# Patient Record
Sex: Female | Born: 2000 | Race: Black or African American | Hispanic: No | Marital: Single | Smoking: Current some day smoker
Health system: Southern US, Community
[De-identification: ages and names within clinical notes are randomized; demographics above are authoritative.]

---
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
## 2018-03-27 IMAGING — DX DG TOE GREAT 2+V*R*
3 series · 3 of 3 positions shown · non-contrast
Comparison: None available

CLINICAL DATA: Right great toe injury, pain and swelling

EXAM:
RIGHT GREAT TOE

[toe ap]
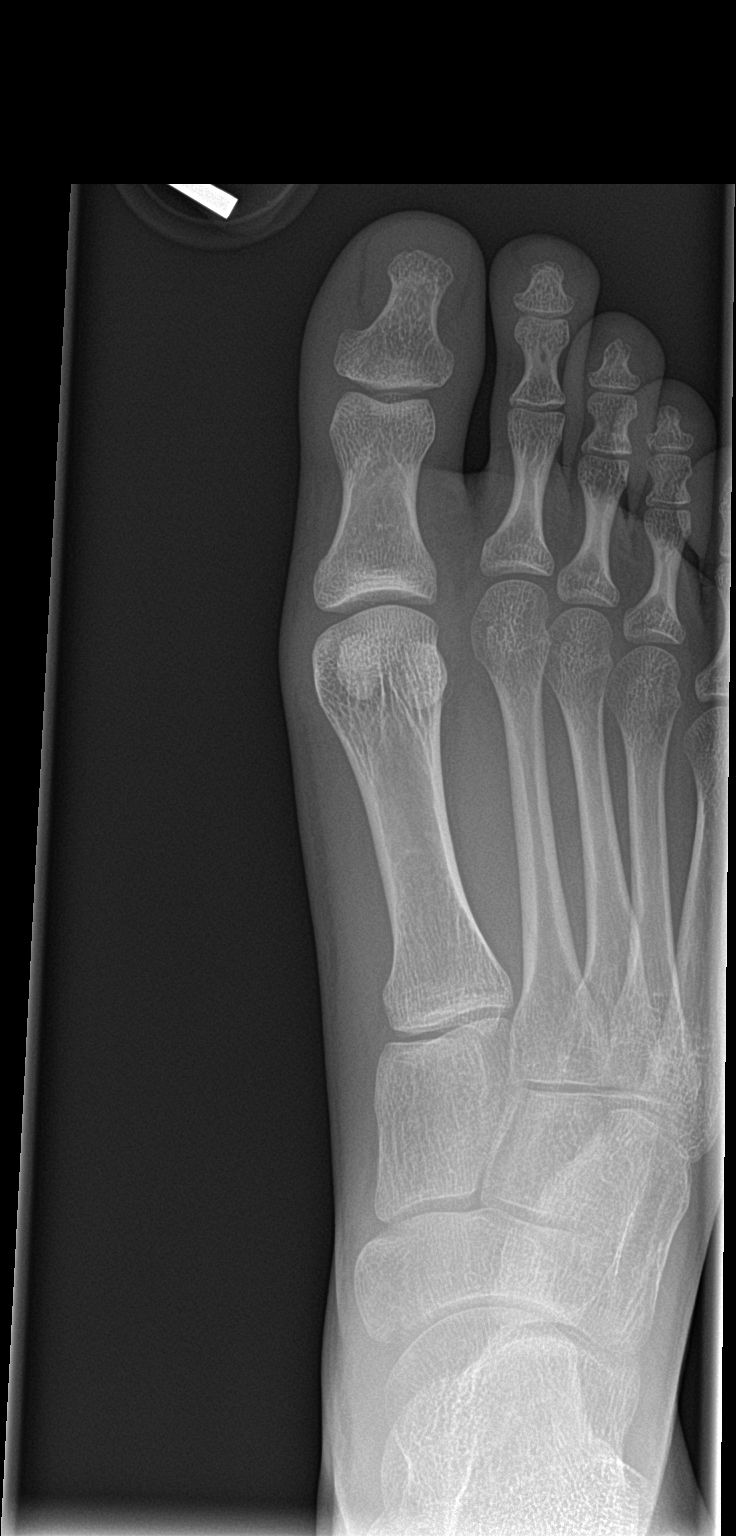

[toe obl]
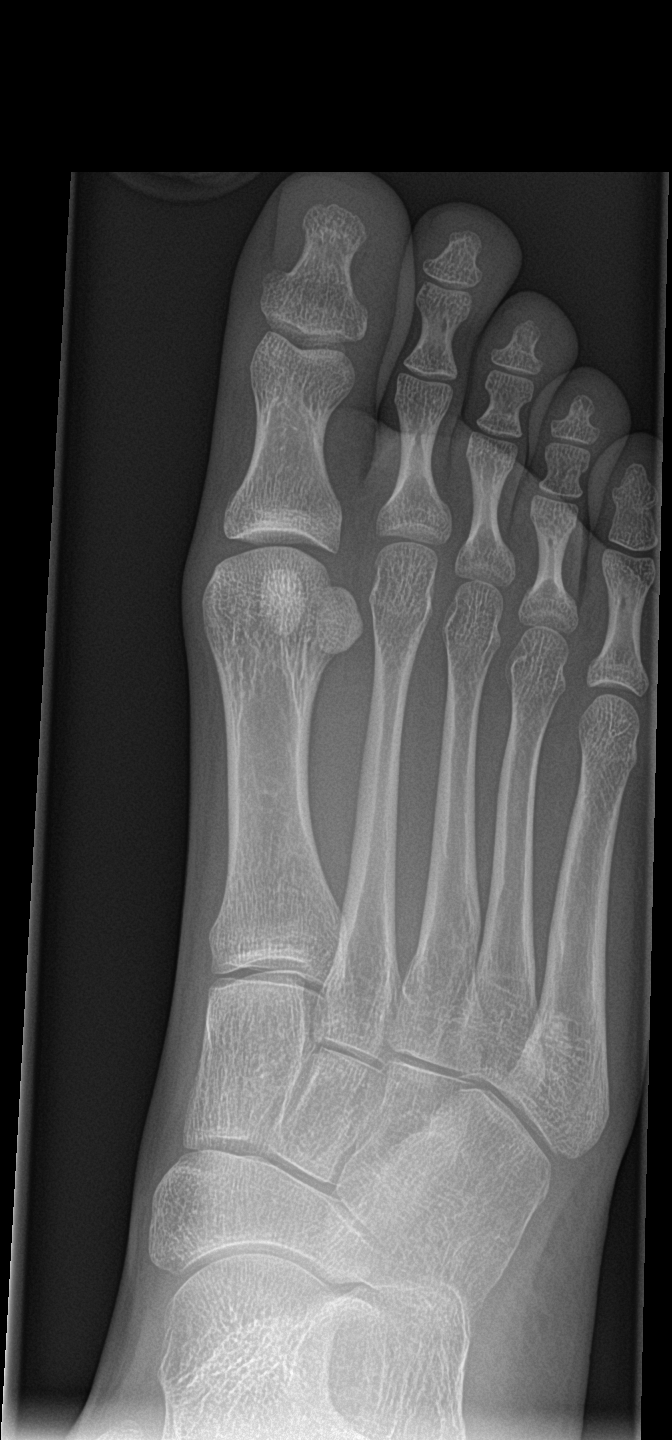

[toe lat]
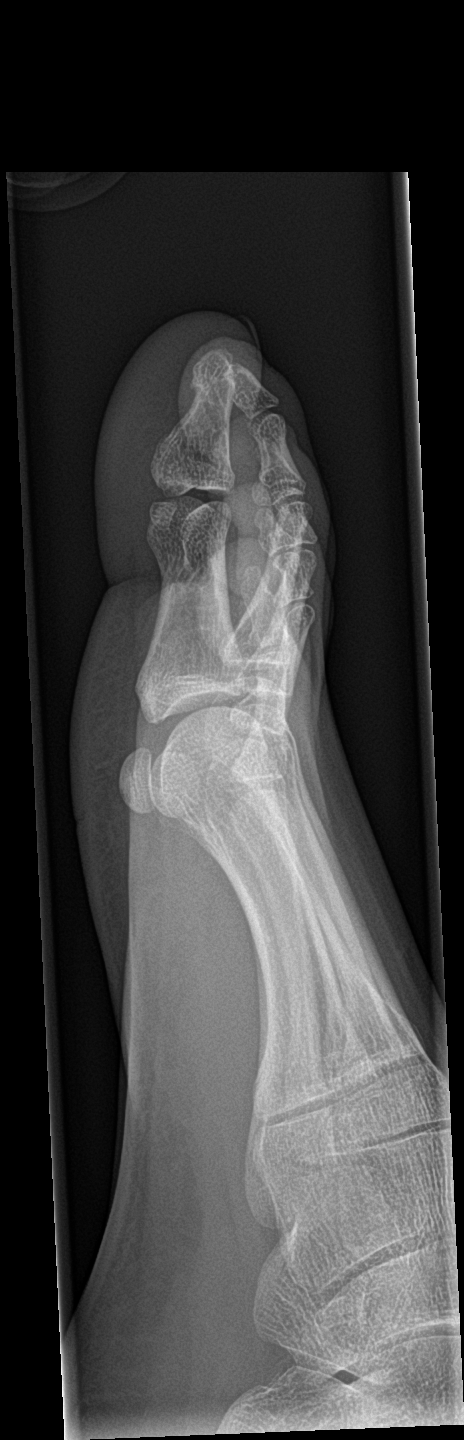

[3 of 3 positions shown; findings below may reference images not displayed]

FINDINGS: Normal alignment.  Mild soft tissue swelling.  No joint abnormality.

On the lateral view, there is cortical irregularity of the great toe
distal phalanx articular surface at the IP joint suspicious for a
nondisplaced right great toe distal phalanx fracture.
IMPRESSION: Findings suspicious for a nondisplaced right great toe distal
phalanx fracture as above.

## 2018-06-15 ENCOUNTER — Emergency Department (HOSPITAL_COMMUNITY)
Admission: EM | Admit: 2018-06-15 | Discharge: 2018-06-15 | Disposition: A | Discharge status: Home / Self Care | Payer: Medicaid Other | Attending: Emergency Medicine | Admitting: Emergency Medicine

## 2018-06-15 ENCOUNTER — Encounter (HOSPITAL_COMMUNITY): Payer: Self-pay

## 2018-06-15 ENCOUNTER — Other Ambulatory Visit: Payer: Self-pay

## 2018-06-15 DIAGNOSIS — N63 Unspecified lump in unspecified breast: Secondary | ICD-10-CM

## 2018-06-15 DIAGNOSIS — Z79899 Other long term (current) drug therapy: Secondary | ICD-10-CM | POA: Insufficient documentation

## 2018-06-15 DIAGNOSIS — N6323 Unspecified lump in the left breast, lower outer quadrant: Secondary | ICD-10-CM | POA: Insufficient documentation

## 2018-06-15 DIAGNOSIS — K2900 Acute gastritis without bleeding: Secondary | ICD-10-CM | POA: Insufficient documentation

## 2018-06-15 LAB — URINALYSIS, ROUTINE W REFLEX MICROSCOPIC
Bilirubin Urine: NEGATIVE
Glucose, UA: NEGATIVE mg/dL
Hgb urine dipstick: NEGATIVE
Ketones, ur: NEGATIVE mg/dL
NITRITE: NEGATIVE
PROTEIN: NEGATIVE mg/dL
Specific Gravity, Urine: 1.015 (ref 1.005–1.030)
pH: 7 (ref 5.0–8.0)

## 2018-06-15 LAB — COMPREHENSIVE METABOLIC PANEL
ALK PHOS: 78 U/L (ref 38–126)
ALT: 12 U/L (ref 0–44)
AST: 21 U/L (ref 15–41)
Albumin: 4.8 g/dL (ref 3.5–5.0)
Anion gap: 10 (ref 5–15)
BUN: 7 mg/dL (ref 6–20)
CALCIUM: 9.8 mg/dL (ref 8.9–10.3)
CO2: 24 mmol/L (ref 22–32)
Chloride: 106 mmol/L (ref 98–111)
Creatinine, Ser: 0.66 mg/dL (ref 0.44–1.00)
GFR calc Af Amer: >60 mL/min (ref >60)
GFR calc non Af Amer: >60 mL/min (ref >60)
Glucose, Bld: 98 mg/dL (ref 70–99)
Potassium: 3.4 mmol/L — ABNORMAL LOW (ref 3.5–5.1)
SODIUM: 140 mmol/L (ref 135–145)
TOTAL PROTEIN: 8.2 g/dL — AB (ref 6.5–8.1)
Total Bilirubin: 0.8 mg/dL (ref 0.3–1.2)

## 2018-06-15 LAB — CBC
HCT: 40.8 % (ref 36.0–46.0)
Hemoglobin: 11.9 g/dL — ABNORMAL LOW (ref 12.0–15.0)
MCH: 25.4 pg — ABNORMAL LOW (ref 26.0–34.0)
MCHC: 29.2 g/dL — ABNORMAL LOW (ref 30.0–36.0)
MCV: 87 fL (ref 80.0–100.0)
PLATELETS: 188 10*3/uL (ref 150–400)
RBC: 4.69 MIL/uL (ref 3.87–5.11)
RDW: 15.9 % — ABNORMAL HIGH (ref 11.5–15.5)
WBC: 6.4 10*3/uL (ref 4.0–10.5)
nRBC: 0 % (ref 0.0–0.2)

## 2018-06-15 LAB — URINALYSIS, MICROSCOPIC (REFLEX)

## 2018-06-15 LAB — PREGNANCY, URINE: PREG TEST UR: NEGATIVE

## 2018-06-15 LAB — LIPASE, BLOOD: LIPASE: 23 U/L (ref 11–51)

## 2018-06-15 MED ORDER — ONDANSETRON HCL 4 MG PO TABS: 4.0000 mg | ORAL_TABLET | Freq: Four times a day (QID) | ORAL | 0 refills | Status: AC | PRN

## 2018-06-15 MED ORDER — LIDOCAINE VISCOUS HCL 2 % MT SOLN
15.0000 mL | Freq: Once | OROMUCOSAL | Status: AC
  Administered 2018-06-15: 15 mL via ORAL
  Filled 2018-06-15: qty 15

## 2018-06-15 MED ORDER — FAMOTIDINE 20 MG PO TABS: 20.0000 mg | ORAL_TABLET | Freq: Two times a day (BID) | ORAL | 0 refills | Status: DC

## 2018-06-15 MED ORDER — FAMOTIDINE 20 MG PO TABS: 20.0000 mg | ORAL_TABLET | Freq: Two times a day (BID) | ORAL | 0 refills | Status: AC

## 2018-06-15 MED ORDER — ALUM & MAG HYDROXIDE-SIMETH 200-200-20 MG/5ML PO SUSP
30.0000 mL | Freq: Once | ORAL | Status: AC
  Administered 2018-06-15: 30 mL via ORAL
  Filled 2018-06-15: qty 30

## 2018-06-15 MED ORDER — ONDANSETRON HCL 4 MG PO TABS: 4.0000 mg | ORAL_TABLET | Freq: Four times a day (QID) | ORAL | 0 refills | Status: DC | PRN

## 2018-06-15 NOTE — ED Notes (Signed)
Pt reports no insurance  Given good RX card for meds

## 2018-06-15 NOTE — ED Provider Notes (Signed)
[facility] EMERGENCY DEPARTMENT Provider Note   CSN: [id number] Arrival date & time: 06/15/18  1304     History   Chief Complaint Chief Complaint  Patient presents with  . Abdominal Pain    HPI Janice Wall is a 18 y.o. female.  HPI Patient presents with epigastric pain that radiates up into her chest.  Patient states the pain is been episodic for some time now.  Currently denying any chest pain.  States the pain is worse after eating ketchup or pizza.  Patient does occasionally take ibuprofen for headaches.  Denies any shortness of breath.  Endorses occasional nausea but no vomiting.  She has had some loose stools but denies melanotic or grossly bloody stool.  No fever or chills.  Patient states she also has had a lump in her left breast for the past year.  States this is not changed in size.  It is nontender.  She has no family history of breast cancer.  No nipple changes. History reviewed. No pertinent past medical history.  There are no active problems to display for this patient.   History reviewed. No pertinent surgical history.   OB History   No obstetric history on file.      Home Medications    Prior to Admission medications   Medication Sig Start Date End Date Taking? Authorizing Provider  famotidine (PEPCID) 20 MG tablet Take 1 tablet (20 mg total) by mouth 2 (two) times daily. 06/15/18   Loren RacerYelverton, Ketan Renz, MD  HYDROcodone-acetaminophen (NORCO/VICODIN) 5-325 MG tablet Take 1 tablet by mouth every 6 (six) hours as needed. 10/07/16   Ivery QualeBryant, Hobson, PA-C  norgestrel-ethinyl estradiol (LO/OVRAL,CRYSELLE) 0.3-30 MG-MCG tablet Take 1 tablet by mouth daily.    [provider]  ondansetron (ZOFRAN) 4 MG tablet Take 1 tablet (4 mg total) by mouth every 6 (six) hours as needed for nausea or vomiting. 06/15/18   Loren RacerYelverton, Denia Mcvicar, MD    Family History History reviewed. No pertinent family history.  Social History Social History   Tobacco Use  . Smoking  status: Never Smoker  . Smokeless tobacco: Never Used  Substance Use Topics  . Alcohol use: No  . Drug use: No     Allergies   Patient has no known allergies.   Review of Systems Review of Systems  Constitutional: Negative for chills and fever.  Respiratory: Negative for cough and shortness of breath.   Cardiovascular: Positive for chest pain. Negative for palpitations and leg swelling.  Gastrointestinal: Positive for abdominal pain and nausea. Negative for constipation, diarrhea and vomiting.  Genitourinary: Negative for dysuria, flank pain, frequency and pelvic pain.  Musculoskeletal: Negative for back pain, myalgias and neck pain.  Skin: Negative for rash and wound.  Neurological: Negative for dizziness, weakness, light-headedness and numbness.  All other systems reviewed and are negative.    Physical Exam Updated Vital Signs BP (!) 126/91 (BP Location: Left Arm)   Pulse 75   Temp 98.6 F (37 C) (Temporal)   Resp 18   Ht 5' (1.524 m)   Wt 47.6 kg   LMP 05/29/2018   SpO2 100%   BMI 20.51 kg/m   Physical Exam Vitals signs and nursing note reviewed. Exam conducted with a chaperone present.  Constitutional:      General: She is not in acute distress.    Appearance: Normal appearance. She is well-developed. She is not ill-appearing.  HENT:     Head: Normocephalic and atraumatic.  Eyes:     Pupils: Pupils  are equal, round, and reactive to light.  Neck:     Musculoskeletal: Normal range of motion and neck supple. No neck rigidity or muscular tenderness.  Cardiovascular:     Rate and Rhythm: Normal rate and regular rhythm.     Heart sounds: No murmur. No friction rub. No gallop.   Pulmonary:     Effort: Pulmonary effort is normal. No respiratory distress.     Breath sounds: Normal breath sounds. No stridor. No wheezing, rhonchi or rales.  Chest:     Chest wall: No tenderness.    Abdominal:     General: Bowel sounds are normal.     Palpations: Abdomen is soft.  There is no mass.     Tenderness: There is abdominal tenderness. There is no right CVA tenderness, left CVA tenderness, guarding or rebound.     Comments: Epigastric tenderness to palpation.  No rebound or guarding.  Musculoskeletal: Normal range of motion.        General: No swelling, tenderness, deformity or signs of injury.     Right lower leg: No edema.     Left lower leg: No edema.     Comments: No lower extremity swelling, asymmetry or tenderness.  Distal pulses intact.  Lymphadenopathy:     Cervical: No cervical adenopathy.  Skin:    General: Skin is warm and dry.     Capillary Refill: Capillary refill takes less than 2 seconds.     Findings: No erythema or rash.  Neurological:     General: No focal deficit present.     Mental Status: She is alert and oriented to person, place, and time.  Psychiatric:        Behavior: Behavior normal.      ED Treatments / Results  Labs (all labs ordered are listed, but only abnormal results are displayed) Labs Reviewed  COMPREHENSIVE METABOLIC PANEL - Abnormal; Notable for the following components:      Result Value   Potassium 3.4 (*)    Total Protein 8.2 (*)    All other components within normal limits  CBC - Abnormal; Notable for the following components:   Hemoglobin 11.9 (*)    MCH 25.4 (*)    MCHC 29.2 (*)    RDW 15.9 (*)    All other components within normal limits  URINALYSIS, ROUTINE W REFLEX MICROSCOPIC - Abnormal; Notable for the following components:   Leukocytes, UA TRACE (*)    All other components within normal limits  URINALYSIS, MICROSCOPIC (REFLEX) - Abnormal; Notable for the following components:   Bacteria, UA FEW (*)    All other components within normal limits  LIPASE, BLOOD  PREGNANCY, URINE    EKG EKG Interpretation  Date/Time:  Saturday June 15 2018 18:09:47 EST Ventricular Rate:  66 PR Interval:    QRS Duration: 102 QT Interval:  429 QTC Calculation: 450 R Axis:   82 Text Interpretation:   Sinus rhythm Borderline short PR interval Confirmed by Palumbo, April ([id number]) on 06/16/2018 9:24:13 AM   Radiology No results found.  Procedures Procedures (including critical care time)  Medications Ordered in ED Medications  alum & mag hydroxide-simeth (MAALOX/MYLANTA) 200-200-20 MG/5ML suspension 30 mL (30 mLs Oral Given 06/15/18 1638)    And  lidocaine (XYLOCAINE) 2 % viscous mouth solution 15 mL (15 mLs Oral Given 06/15/18 1637)     Initial Impression / Assessment and Plan / ED Course  I have reviewed the triage vital signs and the nursing notes.  Pertinent labs &  imaging results that were available during my care of the patient were reviewed by me and considered in my medical decision making (see chart for details).     Suspect gastritis with reflux disease.  Low suspicion for CAD. Breast mass without concerning signs for malignancy.  Have encouraged patient to follow-up.  Patient says she is feeling much better after GI cocktail.  Will start on Pepcid.  Return precautions given. Final Clinical Impressions(s) / ED Diagnoses   Final diagnoses:  Acute gastritis without hemorrhage, unspecified gastritis type  Breast nodule    ED Discharge Orders         Ordered    famotidine (PEPCID) 20 MG tablet  2 times daily,   Status:  Discontinued     06/15/18 1840    ondansetron (ZOFRAN) 4 MG tablet  Every 6 hours PRN,   Status:  Discontinued     06/15/18 1840    famotidine (PEPCID) 20 MG tablet  2 times daily     06/15/18 1844    ondansetron (ZOFRAN) 4 MG tablet  Every 6 hours PRN     06/15/18 1844           Loren RacerYelverton, Jabarri Stefanelli, MD 06/16/18 1529

## 2018-06-15 NOTE — ED Triage Notes (Signed)
Pt reports epigastric pain that began this morning and feels like something is getting stuck. Also has marble sized area to left breast for months

## 2018-10-11 ENCOUNTER — Other Ambulatory Visit: Payer: Self-pay

## 2018-10-11 ENCOUNTER — Encounter (HOSPITAL_COMMUNITY): Payer: Self-pay | Admitting: Emergency Medicine

## 2018-10-11 ENCOUNTER — Emergency Department (HOSPITAL_COMMUNITY)
Admission: EM | Admit: 2018-10-11 | Discharge: 2018-10-11 | Disposition: A | Discharge status: Home / Self Care | Payer: Self-pay | Attending: Emergency Medicine | Admitting: Emergency Medicine

## 2018-10-11 DIAGNOSIS — Z041 Encounter for examination and observation following transport accident: Secondary | ICD-10-CM | POA: Insufficient documentation

## 2018-10-11 DIAGNOSIS — Z5321 Procedure and treatment not carried out due to patient leaving prior to being seen by health care provider: Secondary | ICD-10-CM | POA: Insufficient documentation

## 2018-10-11 NOTE — ED Notes (Signed)
Pt not in the waiting room when called
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
## 2018-10-12 ENCOUNTER — Emergency Department (HOSPITAL_COMMUNITY): Payer: No Typology Code available for payment source

## 2018-10-12 ENCOUNTER — Encounter (HOSPITAL_COMMUNITY): Payer: Self-pay | Admitting: Emergency Medicine

## 2018-10-12 ENCOUNTER — Other Ambulatory Visit: Payer: Self-pay

## 2018-10-12 ENCOUNTER — Emergency Department (HOSPITAL_COMMUNITY)
Admission: EM | Admit: 2018-10-12 | Discharge: 2018-10-13 | Disposition: A | Discharge status: Home / Self Care | Payer: No Typology Code available for payment source | Attending: Emergency Medicine | Admitting: Emergency Medicine

## 2018-10-12 DIAGNOSIS — Y939 Activity, unspecified: Secondary | ICD-10-CM | POA: Insufficient documentation

## 2018-10-12 DIAGNOSIS — G44309 Post-traumatic headache, unspecified, not intractable: Secondary | ICD-10-CM | POA: Diagnosis not present

## 2018-10-12 DIAGNOSIS — Y999 Unspecified external cause status: Secondary | ICD-10-CM | POA: Insufficient documentation

## 2018-10-12 DIAGNOSIS — Y92410 Unspecified street and highway as the place of occurrence of the external cause: Secondary | ICD-10-CM | POA: Insufficient documentation

## 2018-10-12 DIAGNOSIS — M542 Cervicalgia: Secondary | ICD-10-CM | POA: Diagnosis present

## 2018-10-12 DIAGNOSIS — Z79899 Other long term (current) drug therapy: Secondary | ICD-10-CM | POA: Diagnosis not present
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
## 2018-10-12 NOTE — ED Notes (Signed)
Pt ambulated heel to toe to room from triage with erect posture, relaxed facial features   Without stagger or drift
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
## 2018-10-13 NOTE — ED Notes (Signed)
Pt ambulatory to waiting room. Pt verbalized understanding of discharge instructions.   

## 2019-09-11 ENCOUNTER — Emergency Department (HOSPITAL_COMMUNITY): Payer: Self-pay

## 2019-09-11 ENCOUNTER — Encounter (HOSPITAL_COMMUNITY): Payer: Self-pay | Admitting: Emergency Medicine

## 2019-09-11 ENCOUNTER — Emergency Department (HOSPITAL_COMMUNITY)
Admission: EM | Admit: 2019-09-11 | Discharge: 2019-09-11 | Disposition: A | Discharge status: Home / Self Care | Payer: Self-pay | Attending: Emergency Medicine | Admitting: Emergency Medicine

## 2019-09-11 ENCOUNTER — Other Ambulatory Visit: Payer: Self-pay

## 2019-09-11 DIAGNOSIS — Y939 Activity, unspecified: Secondary | ICD-10-CM | POA: Insufficient documentation

## 2019-09-11 DIAGNOSIS — F17210 Nicotine dependence, cigarettes, uncomplicated: Secondary | ICD-10-CM | POA: Insufficient documentation

## 2019-09-11 DIAGNOSIS — W19XXXA Unspecified fall, initial encounter: Secondary | ICD-10-CM | POA: Insufficient documentation

## 2019-09-11 DIAGNOSIS — Y929 Unspecified place or not applicable: Secondary | ICD-10-CM | POA: Insufficient documentation

## 2019-09-11 DIAGNOSIS — Y999 Unspecified external cause status: Secondary | ICD-10-CM | POA: Insufficient documentation

## 2019-09-11 DIAGNOSIS — S43014A Anterior dislocation of right humerus, initial encounter: Secondary | ICD-10-CM | POA: Insufficient documentation

## 2019-09-11 DIAGNOSIS — Z79899 Other long term (current) drug therapy: Secondary | ICD-10-CM | POA: Insufficient documentation

## 2019-09-11 LAB — I-STAT BETA HCG BLOOD, ED (MC, WL, AP ONLY): I-stat hCG, quantitative: <5 m[IU]/mL (ref <5)

## 2019-09-11 MED ORDER — KETAMINE HCL 10 MG/ML IJ SOLN
1.0000 mg/kg | Freq: Once | INTRAMUSCULAR | Status: AC
  Administered 2019-09-11: 14:00:00 39 mg via INTRAVENOUS
  Filled 2019-09-11: qty 1

## 2019-09-11 MED ORDER — ONDANSETRON HCL 4 MG/2ML IJ SOLN
4.0000 mg | Freq: Once | INTRAMUSCULAR | Status: AC
  Administered 2019-09-11: 4 mg via INTRAVENOUS
  Filled 2019-09-11: qty 2

## 2019-09-11 MED ORDER — FENTANYL CITRATE (PF) 100 MCG/2ML IJ SOLN
50.0000 ug | Freq: Once | INTRAMUSCULAR | Status: AC
  Administered 2019-09-11: 13:00:00 50 ug via INTRAVENOUS
  Filled 2019-09-11: qty 2

## 2019-09-11 MED ORDER — HYDROCODONE-ACETAMINOPHEN 5-325 MG PO TABS: 1.0000 | ORAL_TABLET | Freq: Four times a day (QID) | ORAL | 0 refills | Status: AC | PRN

## 2019-09-11 MED ORDER — PROPOFOL 10 MG/ML IV BOLUS
1.0000 mg/kg | Freq: Once | INTRAVENOUS | Status: AC
  Administered 2019-09-11: 39 mg via INTRAVENOUS
  Filled 2019-09-11: qty 20

## 2019-09-11 MED ORDER — KETAMINE HCL 10 MG/ML IJ SOLN
INTRAMUSCULAR | Status: AC | PRN
  Administered 2019-09-11: 19.5 mg via INTRAVENOUS

## 2019-09-11 MED ORDER — PROPOFOL 10 MG/ML IV BOLUS
INTRAVENOUS | Status: AC | PRN
  Administered 2019-09-11: 39 mg via INTRAVENOUS

## 2019-09-11 MED ORDER — SODIUM CHLORIDE 0.9 % IV BOLUS
1000.0000 mL | Freq: Once | INTRAVENOUS | Status: AC
  Administered 2019-09-11: 1000 mL via INTRAVENOUS

## 2019-09-11 NOTE — ED Notes (Signed)
Have wasted 161 mg of Propofol with Roma Kayser RN.

## 2019-09-11 NOTE — ED Triage Notes (Signed)
Patient c/o rt shoulder dislocation with numbness and tingling.

## 2019-09-11 NOTE — ED Provider Notes (Signed)
[facility] EMERGENCY DEPARTMENT Provider Note   CSN: [id number] Arrival date & time: 09/11/19  1158     History Chief Complaint  Patient presents with  . Shoulder Pain    Janice Wall is a 19 y.o. female who presents for evaluation of right shoulder dislocation.  Patient reports that she was playing with some children earlier this morning and fell, landing on her right shoulder.  Since then she has had pain and difficulty moving the shoulder.  She also reports some numbness noted to the upper arm.  She tried to put it back in place at home but was unsuccessful, prompting ED visit.  She has never had this happen before.   The history is provided by the patient.       History reviewed. No pertinent past medical history.  There are no problems to display for this patient.   History reviewed. No pertinent surgical history.   OB History    Gravida      Para      Term      Preterm      AB      Living  0     SAB      TAB      Ectopic      Multiple      Live Births              No family history on file.  Social History   Tobacco Use  . Smoking status: Current Some Day Smoker    Types: Cigarettes  . Smokeless tobacco: Never Used  Substance Use Topics  . Alcohol use: Yes    Comment: occas  . Drug use: No    Home Medications Prior to Admission medications   Medication Sig Start Date End Date Taking? Authorizing Provider  famotidine (PEPCID) 20 MG tablet Take 1 tablet (20 mg total) by mouth 2 (two) times daily. 06/15/18   Loren Racer, MD  HYDROcodone-acetaminophen (NORCO/VICODIN) 5-325 MG tablet Take 1-2 tablets by mouth every 6 (six) hours as needed. 09/11/19   Maxwell Caul, PA-C  norgestrel-ethinyl estradiol (LO/OVRAL,CRYSELLE) 0.3-30 MG-MCG tablet Take 1 tablet by mouth daily.    [provider]  ondansetron (ZOFRAN) 4 MG tablet Take 1 tablet (4 mg total) by mouth every 6 (six) hours as needed for nausea or vomiting. 06/15/18    Loren Racer, MD    Allergies    Patient has no known allergies.  Review of Systems   Review of Systems  Musculoskeletal:       Right shoulder pain  Neurological: Positive for numbness. Negative for weakness.  All other systems reviewed and are negative.   Physical Exam Updated Vital Signs BP 134/90   Pulse 87   Temp 99.4 F (37.4 C) (Oral)   Resp (!) 21   Ht 5\' 1"  (1.549 m)   Wt 39 kg   LMP 08/29/2019 (Approximate)   SpO2 100%   BMI 16.25 kg/m   Physical Exam Vitals and nursing note reviewed.  Constitutional:      Appearance: She is well-developed.  HENT:     Head: Normocephalic and atraumatic.  Eyes:     General: No scleral icterus.       Right eye: No discharge.        Left eye: No discharge.     Conjunctiva/sclera: Conjunctivae normal.  Cardiovascular:     Pulses:          Radial pulses are 2+ on the right  side and 2+ on the left side.  Pulmonary:     Effort: Pulmonary effort is normal.  Musculoskeletal:     Comments: Tenderness palpation in right shoulder with obvious deformity.  Limited range of motion secondary to pain.  No bony tenderness noted right elbow, right forearm, right wrist.  No tenderness palpation in left upper extremity.  Full range of motion of left shoulder without any difficulty.  Skin:    General: Skin is warm and dry.     Comments: Good distal cap refill.  RUE is not dusky in appearance or cool to touch.  Neurological:     Mental Status: She is alert.     Comments: Slightly decreased sensation noted to upper arm.  She has full sensation intact distally.  Psychiatric:        Speech: Speech normal.        Behavior: Behavior normal.     ED Results / Procedures / Treatments   Labs (all labs ordered are listed, but only abnormal results are displayed) Labs Reviewed  I-STAT BETA HCG BLOOD, ED ([facility], [facility], [facility] ONLY)    EKG None  Radiology DG Shoulder Right  Result Date: 09/11/2019 CLINICAL DATA:  Post reduction for anterior  dislocation EXAM: RIGHT SHOULDER - 2+ VIEW COMPARISON:  Pre reduction radiographs obtained Sep 11, 2019 earlier in the day FINDINGS: Frontal and Y scapular images were obtained. There has been successful reduction of anterior dislocation compared to study obtained earlier in the day. Currently no fracture or dislocation. No evident arthropathy. Visualized right lung clear. IMPRESSION: Successful reduction of anterior shoulder dislocation. Currently no fracture or dislocation. No evident arthropathy. Electronically Signed   By: Lowella Grip III M.D.   On: 09/11/2019 14:04   DG Shoulder Right  Result Date: 09/11/2019 CLINICAL DATA:  Shoulder pain. Fell on RIGHT shoulder while playing outside with the kids. Unable to move the arm. EXAM: RIGHT SHOULDER - 2+ VIEW COMPARISON:  Chest x-ray on 07/29/2016 FINDINGS: There is anterior dislocation of the RIGHT shoulder, associated with Hill-Sachs deformity of the humeral head. No other acute fracture identified. RIGHT lung apex is clear. IMPRESSION: Anterior dislocation of the RIGHT shoulder. Electronically Signed   By: Nolon Nations M.D.   On: 09/11/2019 12:58    Procedures Reduction of dislocation  Date/Time: 09/11/2019 3:02 PM Performed by: Volanda Napoleon, PA-C Authorized by: Volanda Napoleon, PA-C  Consent: Verbal consent obtained. Risks and benefits: risks, benefits and alternatives were discussed Consent given by: patient Patient understanding: patient states understanding of the procedure being performed Patient consent: the patient's understanding of the procedure matches consent given Procedure consent: procedure consent matches procedure scheduled Relevant documents: relevant documents present and verified Site marked: the operative site was marked Imaging studies: imaging studies available Required items: required blood products, implants, devices, and special equipment available Time out: Immediately prior to procedure a "time out" was  called to verify the correct patient, procedure, equipment, support staff and site/side marked as required. Patient tolerance: patient tolerated the procedure well with no immediate complications    (including critical care time)  Medications Ordered in ED Medications  fentaNYL (SUBLIMAZE) injection 50 mcg (50 mcg Intravenous Given 09/11/19 1318)  ondansetron (ZOFRAN) injection 4 mg (4 mg Intravenous Given 09/11/19 1314)  sodium chloride 0.9 % bolus 1,000 mL (0 mLs Intravenous Stopped 09/11/19 1439)  propofol (DIPRIVAN) 10 mg/mL bolus/IV push 39 mg (39 mg Intravenous Given 09/11/19 1337)  ketamine (KETALAR) injection 39 mg (39 mg Intravenous Given  09/11/19 1339)  propofol (DIPRIVAN) 10 mg/mL bolus/IV push (39 mg Intravenous Given 09/11/19 1344)  ketamine (KETALAR) injection (19.5 mg Intravenous Given 09/11/19 1346)    ED Course  I have reviewed the triage vital signs and the nursing notes.  Pertinent labs & imaging results that were available during my care of the patient were reviewed by me and considered in my medical decision making (see chart for details).    MDM Rules/Calculators/A&P                      19 year old female who presents for evaluation of shoulder pain after mechanical fall that occurred earlier this morning.  Limited range of motion secondary to pain since the accident.  She reports little bit of numbness to the upper arm.  On initial arrival, she is afebrile, nontoxic-appearing.  Vital signs are stable.  On exam, she has tenderness noted to the right shoulder with obvious deformity.  Concern for dislocation versus fracture.  Plan for x-rays.  X-ray shows anterior dislocation of the right shoulder associated with Hill-Sachs deformity of the humeral head.  Reduction as documented above.  Patient tolerated procedure well.  Please see separate attending note for sedation note.  Post reduction x-ray shows successful reduction.  Patient placed in sling immobilizer.  Reevaluation.   Patient is alert and oriented.  Patient with good radial pulse.  Instructed patient she will need to follow-up with orthopedics. At this time, patient exhibits no emergent life-threatening condition that require further evaluation in ED or admission. Patient had ample opportunity for questions and discussion. All patient's questions were answered with full understanding. Strict return precautions discussed. Patient expresses understanding and agreement to plan.   Portions of this note were generated with Scientist, clinical (histocompatibility and immunogenetics). Dictation errors may occur despite best attempts at proofreading.  Final Clinical Impression(s) / ED Diagnoses Final diagnoses:  Anterior dislocation of right shoulder, initial encounter    Rx / DC Orders ED Discharge Orders         Ordered    HYDROcodone-acetaminophen (NORCO/VICODIN) 5-325 MG tablet  Every 6 hours PRN     09/11/19 1458           Maxwell Caul, PA-C 09/11/19 1502    Sabas Sous, MD 09/14/19 [phone]

## 2019-09-11 NOTE — Discharge Instructions (Signed)
You can take Tylenol or Ibuprofen as directed for pain. You can alternate Tylenol and Ibuprofen every 4 hours. If you take Tylenol at 1pm, then you can take Ibuprofen at 5pm. Then you can take Tylenol again at 9pm.   Take pain medications as directed for break through pain. Do not drive or operate machinery while taking this medication.   Wear the sling immobilizer at all times until you see Ortho.  Call Ortho and arrange for an outpatient appointment.  Return to the emergency department for any worsening pain, repeat dislocation, numbness or any other worsening or concerning symptoms.

## 2019-09-14 NOTE — ED Provider Notes (Signed)
.  Sedation  Date/Time: 09/14/2019 3:17 PM Performed by: Sabas Sous, MD Authorized by: Sabas Sous, MD   Consent:    Consent obtained:  Verbal and written   Consent given by:  Patient   Risks discussed:  Allergic reaction, dysrhythmia, inadequate sedation, nausea, prolonged hypoxia resulting in organ damage, prolonged sedation necessitating reversal, respiratory compromise necessitating ventilatory assistance and intubation and vomiting   Alternatives discussed:  Analgesia without sedation, anxiolysis and regional anesthesia Universal protocol:    Procedure explained and questions answered to patient or proxy's satisfaction: yes     Relevant documents present and verified: yes     Test results available and properly labeled: yes     Imaging studies available: yes     Required blood products, implants, devices, and special equipment available: yes     Site/side marked: yes     Immediately prior to procedure a time out was called: yes     Patient identity confirmation method:  Verbally with patient, arm band, hospital-assigned identification number and provided demographic data Indications:    Procedure necessitating sedation performed by:  Different physician Pre-sedation assessment:    Time since last food or drink:  4 hours   ASA classification: class 1 - normal, healthy patient     Neck mobility: normal     Mouth opening:  3 or more finger widths   Thyromental distance:  4 finger widths   Mallampati score:  I - soft palate, uvula, fauces, pillars visible   Pre-sedation assessments completed and reviewed: airway patency, cardiovascular function, hydration status, mental status, nausea/vomiting, pain level, respiratory function and temperature   Immediate pre-procedure details:    Reassessment: Patient reassessed immediately prior to procedure     Reviewed: vital signs, relevant labs/tests and NPO status     Verified: bag valve mask available, emergency equipment available,  intubation equipment available, IV patency confirmed, oxygen available and suction available   Procedure details (see MAR for exact dosages):    Preoxygenation:  Nasal cannula   Sedation:  Propofol and ketamine   Intended level of sedation: deep   Intra-procedure monitoring:  Blood pressure monitoring, cardiac monitor, continuous pulse oximetry, frequent LOC assessments, frequent vital sign checks and continuous capnometry   Intra-procedure events: none     Total Provider sedation time (minutes):  22 Post-procedure details:    Attendance: Constant attendance by certified staff until patient recovered     Recovery: Patient returned to pre-procedure baseline     Post-sedation assessments completed and reviewed: airway patency, cardiovascular function, hydration status, mental status, nausea/vomiting, pain level, respiratory function and temperature     Patient is stable for discharge or admission: yes     Patient tolerance:  Tolerated well, no immediate complications Comments:     Uncomplicated procedural sedation using 40 mg propofol and 20 mg ketamine.      Sabas Sous, MD 09/14/19 [phone]

## 2019-09-19 ENCOUNTER — Other Ambulatory Visit: Payer: Self-pay

## 2019-09-19 ENCOUNTER — Ambulatory Visit: Payer: Self-pay | Admitting: Orthopedic Surgery

## 2019-09-19 VITALS — Temp 98.6°F | Ht 61.5 in | Wt 81.0 lb

## 2019-09-19 DIAGNOSIS — S43004A Unspecified dislocation of right shoulder joint, initial encounter: Secondary | ICD-10-CM

## 2019-09-19 NOTE — Patient Instructions (Signed)
14 days of sling wear   Start therapy

## 2019-09-19 NOTE — Progress Notes (Signed)
EMERGENCY ROOM FOLLOW UP  NEW PROBLEM/PATIENT   Patient ID: Janice Wall, female   DOB: 28-Apr-2001, 19 y.o.   MRN: [id number]  Emergency room record from (date) 5/6 has been reviewed and this is included by reference and includes the review of systems with the following addition:   Chief Complaint  Patient presents with  . Shoulder Injury    ER follow up on right shoulder, DOI 09-11-19.    HPI Janice Wall is a 19 y.o. female.  Presents after emergency room visit for shoulder dislocation  19 year old female right-hand-dominant first-time traumatic shoulder dislocation sustained while she was playing with her children.  She had a closed reduction in the emergency room she was placed in a sling.  When she is out of the sling she says the shoulder aches and she does complain of sensation changes in the lateral deltoid with a numb-like feeling   Review of Systems Review of Systems  Neurological: Positive for numbness.  All other systems reviewed and are negative.    has no past medical history on file.   No past surgical history on file.  No family history on file.  Social History Social History   Tobacco Use  . Smoking status: Current Some Day Smoker    Types: Cigarettes  . Smokeless tobacco: Never Used  Substance Use Topics  . Alcohol use: Yes    Comment: occas  . Drug use: No    No Known Allergies  Current Outpatient Medications  Medication Sig Dispense Refill  . famotidine (PEPCID) 20 MG tablet Take 1 tablet (20 mg total) by mouth 2 (two) times daily. 30 tablet 0  . HYDROcodone-acetaminophen (NORCO/VICODIN) 5-325 MG tablet Take 1-2 tablets by mouth every 6 (six) hours as needed. 8 tablet 0  . norgestrel-ethinyl estradiol (LO/OVRAL,CRYSELLE) 0.3-30 MG-MCG tablet Take 1 tablet by mouth daily.    . ondansetron (ZOFRAN) 4 MG tablet Take 1 tablet (4 mg total) by mouth every 6 (six) hours as needed for nausea or vomiting. 12 tablet 0   No current facility-administered  medications for this visit.    Physical Exam Temp 98.6 F (37 C)   Ht 5' 1.5" (1.562 m)   Wt 81 lb (36.7 kg)   LMP 08/29/2019 (Approximate)   BMI 15.06 kg/m  Body mass index is 15.06 kg/m.  Well developed and well nourished  Stands with normal weight bearing line  Alert and oriented x 3  Normal affect and mood  Ortho Exam  Left shoulder full range of motion does not feel unstable strength is normal skin is intact neurovascular exam is normal and there is no lymphadenopathy or tenderness.  Right shoulder is tender around the anterior joint line there is decreased sensation in the lateral deltoid range of motion up to 90 degrees flexion and abduction are normal she was not put in the abduction external rotation dislocating position lymph nodes are normal pulses are good  No neurologic deficits on hand examination Data Reviewed IMAGING From THE ER AND THE REPORT ARE REVIEWED, MY INTERPRETATION OF THE IMAGE(S) IS :  X-ray #1 dislocation right shoulder  X-ray #2 relocation right shoulder with 2 views Assessment  First time traumatic dislocation  Plan   Patient at risk for redislocation and made aware  Physical therapy  Wear sling 2 more weeks  Follow-up 6 weeks   Fuller Canada, MD 09/19/2019 10:15 AM

## 2019-09-26 ENCOUNTER — Ambulatory Visit (HOSPITAL_COMMUNITY): Payer: Self-pay | Admitting: Specialist

## 2019-09-26 ENCOUNTER — Telehealth (HOSPITAL_COMMUNITY): Payer: Self-pay | Admitting: Specialist

## 2019-09-26 NOTE — Telephone Encounter (Signed)
pt called to cx today's appt due to she is out of town. the appt has been rescheduled.

## 2019-10-17 ENCOUNTER — Ambulatory Visit (HOSPITAL_COMMUNITY): Payer: Self-pay | Admitting: Occupational Therapy

## 2019-10-17 ENCOUNTER — Telehealth (HOSPITAL_COMMUNITY): Payer: Self-pay | Admitting: Occupational Therapy

## 2019-10-17 NOTE — Telephone Encounter (Signed)
pt called to reschedule this appt since she was running late.

## 2019-10-31 ENCOUNTER — Ambulatory Visit (HOSPITAL_COMMUNITY): Payer: Self-pay | Attending: Orthopedic Surgery | Admitting: Occupational Therapy

## 2020-05-25 ENCOUNTER — Emergency Department (HOSPITAL_COMMUNITY)
Admission: EM | Admit: 2020-05-25 | Discharge: 2020-05-25 | Disposition: A | Discharge status: Home / Self Care | Payer: Self-pay | Attending: Emergency Medicine | Admitting: Emergency Medicine

## 2020-05-25 ENCOUNTER — Encounter (HOSPITAL_COMMUNITY): Payer: Self-pay | Admitting: *Deleted

## 2020-05-25 ENCOUNTER — Other Ambulatory Visit: Payer: Self-pay

## 2020-05-25 DIAGNOSIS — J020 Streptococcal pharyngitis: Secondary | ICD-10-CM | POA: Insufficient documentation

## 2020-05-25 DIAGNOSIS — M791 Myalgia, unspecified site: Secondary | ICD-10-CM | POA: Insufficient documentation

## 2020-05-25 DIAGNOSIS — Z20822 Contact with and (suspected) exposure to covid-19: Secondary | ICD-10-CM | POA: Insufficient documentation

## 2020-05-25 DIAGNOSIS — F17210 Nicotine dependence, cigarettes, uncomplicated: Secondary | ICD-10-CM | POA: Insufficient documentation

## 2020-05-25 LAB — GROUP A STREP BY PCR: Group A Strep by PCR: DETECTED — AB

## 2020-05-25 LAB — SARS CORONAVIRUS 2 (TAT 6-24 HRS): SARS Coronavirus 2: NEGATIVE

## 2020-05-25 MED ORDER — LIDOCAINE VISCOUS HCL 2 % MT SOLN
15.0000 mL | Freq: Once | OROMUCOSAL | Status: AC
  Administered 2020-05-25: 15 mL via OROMUCOSAL
  Filled 2020-05-25: qty 15

## 2020-05-25 MED ORDER — PENICILLIN G BENZATHINE 1200000 UNIT/2ML IM SUSP
1.2000 10*6.[IU] | Freq: Once | INTRAMUSCULAR | Status: AC
  Administered 2020-05-25: 1.2 10*6.[IU] via INTRAMUSCULAR
  Filled 2020-05-25: qty 2

## 2020-05-25 MED ORDER — ACETAMINOPHEN 500 MG PO TABS
1000.0000 mg | ORAL_TABLET | Freq: Once | ORAL | Status: AC
  Administered 2020-05-25: 1000 mg via ORAL
  Filled 2020-05-25: qty 2

## 2020-05-25 NOTE — Discharge Instructions (Signed)
Take Tylenol and ibuprofen as needed for pain.  Make sure to drink plenty of fluids.  COVID test pending.  Results will be on MyChart and you will be called once a result.  Symptoms likely due to strep throat however the  Return for any worsening symptoms

## 2020-05-25 NOTE — ED Provider Notes (Signed)
[facility] - Inpatient EMERGENCY DEPARTMENT Provider Note   CSN: [id number] Arrival date & time: 05/25/20  1020    History Chief Complaint  Patient presents with  . Sore Throat    Janice Janice Wall is Janice Wall 20 y.o. female with this and past medical history who presents for evaluation of sore throat.  Began 2 days ago.  Pain located center of her throat.  She has had chills and fatigue.  No fever.  No COVID exposures.  She is not vaccinated.  Has had some mild myalgias.  She denies headache, lightness, dizziness, congestion, rhinorrhea, drooling, dysphagia, trismus, cough, chest pain, shortness of breath abdominal pain, diarrhea, dysuria.  Denies additional aggravating or alleviating factors.  History obtained from patient and past medical records.  No interpreter used.  HPI     History reviewed. No pertinent past medical history.  There are no problems to display for this patient.   History reviewed. No pertinent surgical history.   OB History    Gravida      Para      Term      Preterm      AB      Living  0     SAB      IAB      Ectopic      Multiple      Live Births              History reviewed. No pertinent family history.  Social History   Tobacco Use  . Smoking status: Current Some Day Smoker    Types: Cigarettes  . Smokeless tobacco: Never Used  Vaping Use  . Vaping Use: Former  Substance Use Topics  . Alcohol use: Yes    Comment: occas  . Drug use: No    Home Medications Prior to Admission medications   Medication Sig Start Date End Date Taking? Authorizing Provider  famotidine (PEPCID) 20 MG tablet Take 1 tablet (20 mg total) by mouth 2 (two) times daily. 06/15/18   Loren Racer, MD  HYDROcodone-acetaminophen (NORCO/VICODIN) 5-325 MG tablet Take 1-2 tablets by mouth every 6 (six) hours as needed. 09/11/19   Maxwell Caul, PA-C  norgestrel-ethinyl estradiol (LO/OVRAL,CRYSELLE) 0.3-30 MG-MCG tablet Take 1 tablet by mouth daily.    [provider]  ondansetron (ZOFRAN) 4 MG tablet Take 1 tablet (4 mg total) by mouth every 6 (six) hours as needed for nausea or vomiting. 06/15/18   Loren Racer, MD    Allergies    Patient has no known allergies.  Review of Systems   Review of Systems  Constitutional: Positive for chills and fatigue. Negative for activity change, appetite change, diaphoresis and fever.  HENT: Positive for sore throat. Negative for congestion, drooling, ear discharge, ear pain, facial swelling, mouth sores, postnasal drip, rhinorrhea, sinus pressure, sinus pain, sneezing, trouble swallowing and voice change.   Respiratory: Negative.   Cardiovascular: Negative.   Gastrointestinal: Negative.   Genitourinary: Negative.   Musculoskeletal: Positive for myalgias. Negative for arthralgias, back pain, gait problem, joint swelling, neck pain and neck stiffness.  Skin: Negative.   Neurological: Negative.   All other systems reviewed and are negative.   Physical Exam Updated Vital Signs BP 111/74 (BP Location: Right Arm)   Pulse (!) 106   Temp 98.6 F (37 C) (Oral)   Resp 18   Ht 5\' 1"  (1.549 m)   Wt 39 kg   LMP 05/18/2020   SpO2 97%   BMI 16.25 kg/m  Physical Exam Vitals and nursing note reviewed.  Constitutional:      General: She is not in acute distress.    Appearance: She is well-developed and well-nourished. She is not ill-appearing, toxic-appearing or diaphoretic.  HENT:     Head: Normocephalic and atraumatic.     Jaw: There is normal jaw occlusion.     Right Ear: Tympanic membrane and ear canal normal. No drainage, swelling or tenderness. No middle ear effusion. Tympanic membrane is not erythematous.     Left Ear: Tympanic membrane and ear canal normal. No drainage, swelling or tenderness.  No middle ear effusion. Tympanic membrane is not erythematous.     Nose: No congestion or rhinorrhea.     Mouth/Throat:     Lips: Pink.     Mouth: Mucous membranes are moist.     Tongue: No  lesions. Tongue does not deviate from midline.     Palate: No mass and lesions.     Pharynx: Oropharynx is clear. Uvula midline.     Tonsils: No tonsillar exudate or tonsillar abscesses. 0 on the right. 0 on the left.     Comments: Posterior oropharynx erythematous.  No edema.  Tongue midline.  No evidence of PTA or RPA.  Tonsils without edema, erythema or exudate.  No pooling of oral secretions. Eyes:     Pupils: Pupils are equal, round, and reactive to light.  Neck:     Trachea: Trachea and phonation normal.     Comments: No neck stiffness or neck rigidity.  No meningismus Cardiovascular:     Rate and Rhythm: Normal rate.     Pulses: Normal pulses and intact distal pulses.          Radial pulses are 2+ on the right side and 2+ on the left side.       Dorsalis pedis pulses are 2+ on the right side and 2+ on the left side.     Heart sounds: Normal heart sounds.  Pulmonary:     Effort: Pulmonary effort is normal. No respiratory distress.     Breath sounds: Normal breath sounds and air entry.  Chest:     Comments: Equal rise and fall to chest Abdominal:     General: Bowel sounds are normal. There is no distension.     Palpations: Abdomen is soft.     Tenderness: There is no abdominal tenderness.  Musculoskeletal:        General: Normal range of motion.     Cervical back: Full passive range of motion without pain and normal range of motion.     Comments: Moves all 4 extremities without difficulty.  Compartments soft.  No bony tenderness.  Skin:    General: Skin is warm and dry.     Capillary Refill: Capillary refill takes less than 2 seconds.     Comments: No edema, erythema or warmth.  No fluctuance or induration  Neurological:     Mental Status: She is alert.     Comments: Cranial nerves II to XII grossly intact. Ambulatory without difficulty  Psychiatric:        Mood and Affect: Mood and affect normal.     ED Results / Procedures / Treatments   Labs (all labs ordered are  listed, but only abnormal results are displayed) Labs Reviewed  GROUP Janice Wall STREP BY PCR - Abnormal; Notable for the following components:      Result Value   Group Janice Wall Strep by PCR DETECTED (*)    All  other components within normal limits  SARS CORONAVIRUS 2 (TAT 6-24 HRS)    EKG None  Radiology No results found.  Procedures Procedures (including critical care time)  Medications Ordered in ED Medications  penicillin g benzathine (BICILLIN LA) 1200000 UNIT/2ML injection 1.2 Million Units (has no administration in time range)  lidocaine (XYLOCAINE) 2 % viscous mouth solution 15 mL (15 mLs Mouth/Throat Given 05/25/20 1335)  acetaminophen (TYLENOL) tablet 1,000 mg (1,000 mg Oral Given 05/25/20 1334)    ED Course  I have reviewed the triage vital signs and the nursing notes.  Pertinent labs & imaging results that were available during my care of the patient were reviewed by me and considered in my medical decision making (see chart for details).  20 year old presents for evaluation of sore throat, myalgias and fatigue.  She is afebrile, nonseptic, non-ill-appearing.  She is able to tolerate p.o. intake.  Does have erythema to posterior oropharynx however uvula midline.  No evidence of tonsillar edema, erythema or exudate.  No evidence of PTA or RPA.  No pooling of oral secretions.  No neck stiffness or neck rigidity.  No meningismus.  She has not vaccine against COVID.  No known exposures.  Will obtain COVID, strep and reassess.  Strep positive, patient elects for IM Bacillin   COVID pending  Tolerating p.o. intake here in ED.  No evidence of airway compromise.  No pooling of secretions.  Was given IM antibiotics.  Discussed follow-up with PCP, return for new or worsening symptoms.  She is agreeable for this  The patient has been appropriately medically screened and/or stabilized in the ED. I have low suspicion for any other emergent medical condition which would require further screening,  evaluation or treatment in the ED or require inpatient management.  Patient is hemodynamically stable and in no acute distress.  Patient able to ambulate in department prior to ED.  Evaluation does not show acute pathology that would require ongoing or additional emergent interventions while in the emergency department or further inpatient treatment.  I have discussed the diagnosis with the patient and answered all questions.  Pain is been managed while in the emergency department and patient has no further complaints prior to discharge.  Patient is comfortable with plan discussed in room and is stable for discharge at this time.  I have discussed strict return precautions for returning to the emergency department.  Patient was encouraged to follow-up with PCP/specialist refer to at discharge.    MDM Rules/Calculators/Janice Wall&P                         Janice Janice Wall was evaluated in Emergency Department on 05/25/2020 for the symptoms described in the history of present illness. She was evaluated in the context of the global COVID-19 pandemic, which necessitated consideration that the patient might be at risk for infection with the SARS-CoV-2 virus that causes COVID-19. Institutional protocols and algorithms that pertain to the evaluation of patients at risk for COVID-19 are in Janice Wall state of rapid change based on information released by regulatory bodies including the CDC and federal and state organizations. These policies and algorithms were followed during the patient's care in the ED. Final Clinical Impression(s) / ED Diagnoses Final diagnoses:  Strep pharyngitis    Rx / DC Orders ED Discharge Orders    None       Janice Cressy A, PA-C 05/25/20 1512    Benjiman Core, MD 05/25/20 1523

## 2020-05-25 NOTE — ED Triage Notes (Signed)
Pt with sore throat for past 2 days, denies fever.  + chills

## 2021-05-23 ENCOUNTER — Telehealth: Payer: Self-pay

## 2021-05-23 NOTE — Telephone Encounter (Signed)
Telephoned patient at mobile number. Left voice message with [facility] scheduling information. 

## 2022-07-06 ENCOUNTER — Encounter: Payer: Self-pay | Admitting: Radiology

## 2023-03-06 ENCOUNTER — Other Ambulatory Visit: Payer: Self-pay

## 2023-03-06 ENCOUNTER — Emergency Department (HOSPITAL_COMMUNITY)
Admission: EM | Admit: 2023-03-06 | Discharge: 2023-03-06 | Disposition: A | Discharge status: Home / Self Care | Payer: Self-pay | Attending: Emergency Medicine | Admitting: Emergency Medicine

## 2023-03-06 ENCOUNTER — Encounter (HOSPITAL_COMMUNITY): Payer: Self-pay

## 2023-03-06 DIAGNOSIS — N939 Abnormal uterine and vaginal bleeding, unspecified: Secondary | ICD-10-CM | POA: Insufficient documentation

## 2023-03-06 LAB — CBC WITH DIFFERENTIAL/PLATELET
Abs Immature Granulocytes: 0.01 10*3/uL (ref 0.00–0.07)
Basophils Absolute: 0 10*3/uL (ref 0.0–0.1)
Basophils Relative: 0 %
Eosinophils Absolute: 0.1 10*3/uL (ref 0.0–0.5)
Eosinophils Relative: 2 %
HCT: 45.4 % (ref 36.0–46.0)
Hemoglobin: 14.7 g/dL (ref 12.0–15.0)
Immature Granulocytes: 0 %
Lymphocytes Relative: 29 %
Lymphs Abs: 1.6 10*3/uL (ref 0.7–4.0)
MCH: 31 pg (ref 26.0–34.0)
MCHC: 32.4 g/dL (ref 30.0–36.0)
MCV: 95.8 fL (ref 80.0–100.0)
Monocytes Absolute: 0.3 10*3/uL (ref 0.1–1.0)
Monocytes Relative: 6 %
Neutro Abs: 3.4 10*3/uL (ref 1.7–7.7)
Neutrophils Relative %: 63 %
Platelets: 136 10*3/uL — ABNORMAL LOW (ref 150–400)
RBC: 4.74 MIL/uL (ref 3.87–5.11)
RDW: 11.9 % (ref 11.5–15.5)
WBC: 5.4 10*3/uL (ref 4.0–10.5)
nRBC: 0 % (ref 0.0–0.2)

## 2023-03-06 LAB — COMPREHENSIVE METABOLIC PANEL
ALT: 28 U/L (ref 0–44)
AST: 36 U/L (ref 15–41)
Albumin: 5.6 g/dL — ABNORMAL HIGH (ref 3.5–5.0)
Alkaline Phosphatase: 67 U/L (ref 38–126)
Anion gap: 14 (ref 5–15)
BUN: 18 mg/dL (ref 6–20)
CO2: 23 mmol/L (ref 22–32)
Calcium: 10.3 mg/dL (ref 8.9–10.3)
Chloride: 100 mmol/L (ref 98–111)
Creatinine, Ser: 0.9 mg/dL (ref 0.44–1.00)
GFR, Estimated: >60 mL/min (ref >60)
Glucose, Bld: 82 mg/dL (ref 70–99)
Potassium: 3.7 mmol/L (ref 3.5–5.1)
Sodium: 137 mmol/L (ref 135–145)
Total Bilirubin: 1.5 mg/dL — ABNORMAL HIGH (ref 0.3–1.2)
Total Protein: 9.9 g/dL — ABNORMAL HIGH (ref 6.5–8.1)

## 2023-03-06 LAB — TYPE AND SCREEN
ABO/RH(D): O POS
Antibody Screen: NEGATIVE

## 2023-03-06 NOTE — ED Provider Notes (Signed)
[place] EMERGENCY DEPARTMENT AT [facility] Provider Note   CSN: [id number] Arrival date & time: 03/06/23  1440     History  Chief Complaint  Patient presents with   Female GU Problem    Janice Wall is a 22 y.o. female.  Patient reports that she began using Nexplanon for birth control in February of this year.  Patient reports for the last 8 months she has had spotting.  Patient reports that she has spotting almost every day.  Patient came to the emergency department today because she has a past medical history of anemia and wants to make sure that she is not becoming anemic from the bleeding.  Patient denies any fever or chills she is not having any abdominal pain she denies any vaginal discharge she denies any STD risk.  Patient reports that she was on Depo-Provera previously and had heavy bleeding.  Patient has recently moved to [place] her care was previously in [place].  The history is provided by the patient. No language interpreter was used.  Female GU Problem       Home Medications Prior to Admission medications   Medication Sig Start Date End Date Taking? Authorizing Provider  famotidine (PEPCID) 20 MG tablet Take 1 tablet (20 mg total) by mouth 2 (two) times daily. 06/15/18   Loren Racer, MD  HYDROcodone-acetaminophen (NORCO/VICODIN) 5-325 MG tablet Take 1-2 tablets by mouth every 6 (six) hours as needed. 09/11/19   Maxwell Caul, PA-C  norgestrel-ethinyl estradiol (LO/OVRAL,CRYSELLE) 0.3-30 MG-MCG tablet Take 1 tablet by mouth daily.    [provider]  ondansetron (ZOFRAN) 4 MG tablet Take 1 tablet (4 mg total) by mouth every 6 (six) hours as needed for nausea or vomiting. 06/15/18   Loren Racer, MD      Allergies    Patient has no known allergies.    Review of Systems   Review of Systems  All other systems reviewed and are negative.   Physical Exam Updated Vital Signs BP 115/71   Pulse 92   Temp 98.2 F (36.8 C)  (Oral)   Resp (!) 21   Ht 5\' 1"  (1.549 m)   Wt 34.7 kg   SpO2 98%   BMI 14.44 kg/m  Physical Exam Vitals and nursing note reviewed.  Constitutional:      Appearance: She is well-developed.  HENT:     Head: Normocephalic.     Nose: Nose normal.  Eyes:     Pupils: Pupils are equal, round, and reactive to light.  Cardiovascular:     Rate and Rhythm: Normal rate.  Pulmonary:     Effort: Pulmonary effort is normal.  Abdominal:     General: There is no distension.  Musculoskeletal:        General: Normal range of motion.     Cervical back: Normal range of motion.  Skin:    General: Skin is warm.  Neurological:     General: No focal deficit present.     Mental Status: She is alert and oriented to person, place, and time.  Psychiatric:        Mood and Affect: Mood normal.     ED Results / Procedures / Treatments   Labs (all labs ordered are listed, but only abnormal results are displayed) Labs Reviewed  CBC WITH DIFFERENTIAL/PLATELET - Abnormal; Notable for the following components:      Result Value   Platelets 136 (*)    All other components within normal limits  COMPREHENSIVE METABOLIC PANEL - Abnormal; Notable for the following components:   Total Protein 9.9 (*)    Albumin 5.6 (*)    Total Bilirubin 1.5 (*)    All other components within normal limits  TYPE AND SCREEN    EKG EKG Interpretation Date/Time:  Tuesday March 06 2023 14:51:56 EDT Ventricular Rate:  122 PR Interval:  150 QRS Duration:  78 QT Interval:  314 QTC Calculation: 447 R Axis:   84  Text Interpretation: Sinus tachycardia Nonspecific ST and T wave abnormality Abnormal ECG Confirmed by Vonita Moss [phone]) on 03/06/2023 2:55:35 PM  Radiology No results found.  Procedures Procedures    Medications Ordered in ED Medications - No data to display  ED Course/ Medical Decision Making/ A&P                                 Medical Decision Making Patient complains of vaginal  spotting almost daily after beginning to use a Nexplanon implant.  Amount and/or Complexity of Data Reviewed Independent Historian: parent Labs: ordered. Decision-making details documented in ED Course.    Details: Labs ordered reviewed and interpreted patient's hemoglobin is 14.7. ECG/medicine tests: ordered.  Risk Risk Details: I counseled patient on her symptoms.  I advised her her hemoglobin is normal.  She is given the [facility] for [facility] here in [place] to follow-up if she decides she wants to use a different type of birth control.           Final Clinical Impression(s) / ED Diagnoses Final diagnoses:  Abnormal vaginal bleeding    Rx / DC Orders ED Discharge Orders     None      An After Visit Summary was printed and given to the patient.    Elson Areas, [place] 03/06/23 1658    Rondel Baton, MD 03/07/23 [phone]

## 2023-03-06 NOTE — Discharge Instructions (Addendum)
Return if any problems.  Follow up with [facility] if problems persist

## 2023-03-06 NOTE — ED Triage Notes (Signed)
Baby past away 12/07/2022

## 2023-03-06 NOTE — ED Triage Notes (Signed)
Irregular periods since 04/26/2022 when pt had child  Stated that she always has to wear a pad and bleeds almost daily   Pt denies pain
